# Patient Record
Sex: Female | Born: 1960 | Race: White | Hispanic: No | Marital: Married | Smoking: Never smoker
Health system: Southern US, Community
[De-identification: ages and names within clinical notes are randomized; demographics above are authoritative.]

---

## 2011-01-15 ENCOUNTER — Emergency Department (INDEPENDENT_AMBULATORY_CARE_PROVIDER_SITE_OTHER): Payer: 59

## 2011-01-15 ENCOUNTER — Emergency Department (HOSPITAL_BASED_OUTPATIENT_CLINIC_OR_DEPARTMENT_OTHER)
Admission: EM | Admit: 2011-01-15 | Discharge: 2011-01-15 | Disposition: A | Discharge status: Home / Self Care | Payer: 59 | Attending: Emergency Medicine | Admitting: Emergency Medicine

## 2011-01-15 DIAGNOSIS — S52123A Displaced fracture of head of unspecified radius, initial encounter for closed fracture: Secondary | ICD-10-CM

## 2011-01-15 DIAGNOSIS — W19XXXA Unspecified fall, initial encounter: Secondary | ICD-10-CM

## 2011-01-15 DIAGNOSIS — M25629 Stiffness of unspecified elbow, not elsewhere classified: Secondary | ICD-10-CM

## 2011-01-15 DIAGNOSIS — M25429 Effusion, unspecified elbow: Secondary | ICD-10-CM

## 2011-01-15 DIAGNOSIS — Y9354 Activity, bowling: Secondary | ICD-10-CM | POA: Insufficient documentation

## 2011-01-15 DIAGNOSIS — W010XXA Fall on same level from slipping, tripping and stumbling without subsequent striking against object, initial encounter: Secondary | ICD-10-CM | POA: Insufficient documentation

## 2011-01-15 DIAGNOSIS — Y9239 Other specified sports and athletic area as the place of occurrence of the external cause: Secondary | ICD-10-CM | POA: Insufficient documentation

## 2012-02-03 ENCOUNTER — Emergency Department (HOSPITAL_BASED_OUTPATIENT_CLINIC_OR_DEPARTMENT_OTHER)
Admission: EM | Admit: 2012-02-03 | Discharge: 2012-02-03 | Disposition: A | Discharge status: Home / Self Care | Payer: Worker's Compensation | Attending: Emergency Medicine | Admitting: Emergency Medicine

## 2012-02-03 ENCOUNTER — Encounter (HOSPITAL_BASED_OUTPATIENT_CLINIC_OR_DEPARTMENT_OTHER): Payer: Self-pay | Admitting: *Deleted

## 2012-02-03 ENCOUNTER — Emergency Department (HOSPITAL_BASED_OUTPATIENT_CLINIC_OR_DEPARTMENT_OTHER): Payer: Worker's Compensation

## 2012-02-03 DIAGNOSIS — S46919A Strain of unspecified muscle, fascia and tendon at shoulder and upper arm level, unspecified arm, initial encounter: Secondary | ICD-10-CM

## 2012-02-03 DIAGNOSIS — T148XXA Other injury of unspecified body region, initial encounter: Secondary | ICD-10-CM

## 2012-02-03 DIAGNOSIS — IMO0002 Reserved for concepts with insufficient information to code with codable children: Secondary | ICD-10-CM | POA: Insufficient documentation

## 2012-02-03 DIAGNOSIS — W010XXA Fall on same level from slipping, tripping and stumbling without subsequent striking against object, initial encounter: Secondary | ICD-10-CM | POA: Insufficient documentation

## 2012-02-03 DIAGNOSIS — W19XXXA Unspecified fall, initial encounter: Secondary | ICD-10-CM

## 2012-02-03 MED ORDER — OXYCODONE-ACETAMINOPHEN 5-325 MG PO TABS
1.0000 | ORAL_TABLET | Freq: Once | ORAL | Status: AC
  Administered 2012-02-03: 1 via ORAL
  Filled 2012-02-03: qty 1

## 2012-02-03 MED ORDER — MORPHINE SULFATE 4 MG/ML IJ SOLN
4.0000 mg | Freq: Once | INTRAMUSCULAR | Status: AC
  Administered 2012-02-03: 4 mg via INTRAMUSCULAR
  Filled 2012-02-03: qty 1

## 2012-02-03 MED ORDER — OXYCODONE-ACETAMINOPHEN 5-325 MG PO TABS: 2.0000 | ORAL_TABLET | ORAL | Status: AC | PRN

## 2012-02-03 NOTE — ED Notes (Signed)
Pt states she lost her footing and tripped injuring her left elbow and knee. Moves fingers. Feels touch. Cap refill < 3 sec. Abrasions to same. Ice applied. Rings removed and given to daughter.

## 2012-02-03 NOTE — ED Provider Notes (Signed)
History     CSN: [id number]  Arrival date & time 02/03/12  1740   First MD Initiated Contact with Patient 02/03/12 1811      Chief Complaint  Patient presents with  . Fall    (Consider location/radiation/quality/duration/timing/severity/associated sxs/prior treatment) HPI Comments: Pt states that she tripped and fell and is now having left elbow and knee pain:pt states that she has previous fracture to the elbow:pt states that she doesn't want imaging of her knee  Patient is a 51 y.o. female presenting with fall. The history is provided by the patient. No language interpreter was used.  Fall The accident occurred 1 to 2 hours ago. The fall occurred while standing. She landed on a hard floor. There was no blood loss. The point of impact was the left elbow. The pain is present in the left knee and left elbow. The pain is moderate. She was ambulatory at the scene. There was no entrapment after the fall. There was no drug use involved in the accident. There was no alcohol use involved in the accident. She has tried NSAIDs for the symptoms. The treatment provided mild relief.    History reviewed. No pertinent past medical history.  History reviewed. No pertinent past surgical history.  History reviewed. No pertinent family history.  History  Substance Use Topics  . Smoking status: Never Smoker   . Smokeless tobacco: Not on file  . Alcohol Use: No    OB History    Grav Para Term Preterm Abortions TAB SAB Ect Mult Living                  Review of Systems  Constitutional: Negative.   Respiratory: Negative.   Cardiovascular: Negative.   Neurological: Negative.     Allergies  Review of patient's allergies indicates no known allergies.  Home Medications   Current Outpatient Rx  Name Route Sig Dispense Refill  . IBUPROFEN 400 MG PO TABS Oral Take 400 mg by mouth every 6 (six) hours as needed. Patient used this medication for sleep.      BP 125/79  Pulse 68  Temp 98 F  (36.7 C) (Oral)  Resp 20  Ht 5\' 4"  (1.626 m)  Wt 220 lb (99.791 kg)  BMI 37.76 kg/m2  SpO2 100%  Physical Exam  Nursing note and vitals reviewed. Constitutional: She is oriented to person, place, and time. She appears well-developed and well-nourished.  HENT:  Head: Normocephalic and atraumatic.  Cardiovascular: Normal rate and regular rhythm.   Pulmonary/Chest: Effort normal and breath sounds normal.  Musculoskeletal:       Spine non tender:pt has generalized tenderness to the left elbow:pt has full BMW:UXLKGM intact;Neurovascularly intact  Neurological: She is alert and oriented to person, place, and time. She exhibits normal muscle tone. Coordination normal.  Skin:       Abrasion to the left knee and forearm  Psychiatric: She has a normal mood and affect.    ED Course  Procedures (including critical care time)  Labs Reviewed - No data to display Dg Elbow Complete Left  02/03/2012  *RADIOLOGY REPORT*  Clinical Data: Fall, elbow pain. Previous fracture.  LEFT ELBOW - COMPLETE 3+ VIEW  Comparison: 01/15/2011.  Findings:   There is mild irregularity within the radial head in the area of prior fracture.  I see no definite acute fracture.  No joint effusions seen.  IMPRESSION: Irregularity in the area of prior fracture.  No definite acute fracture.  Original Report Authenticated By: Caryn Bee  G. DOVER, M.D.     1. Elbow strain   2. Abrasion   3. Fall       MDM  No acute injury:pt placed in sling for comfort:will treat pt with something for pain        Teressa Lower, NP 02/03/12 2008

## 2012-02-03 NOTE — ED Provider Notes (Signed)
Medical screening examination/treatment/procedure(s) were performed by non-physician practitioner and as supervising physician I was immediately available for consultation/collaboration.   Rolan Bucco, MD 02/03/12 2106

## 2012-02-03 NOTE — Discharge Instructions (Signed)
Abrasions Abrasions are skin scrapes. Their treatment depends on how large and deep the abrasion is. Abrasions do not extend through all layers of the skin. A cut or lesion through all skin layers is called a laceration. HOME CARE INSTRUCTIONS   If you were given a dressing, change it at least once a day or as instructed by your caregiver. If the bandage sticks, soak it off with a solution of water or hydrogen peroxide.   Twice a day, wash the area with soap and water to remove all the cream/ointment. You may do this in a sink, under a tub faucet, or in a shower. Rinse off the soap and pat dry with a clean towel. Look for signs of infection (see below).   Reapply cream/ointment according to your caregiver's instruction. This will help prevent infection and keep the bandage from sticking. Telfa or gauze over the wound and under the dressing or wrap will also help keep the bandage from sticking.   If the bandage becomes wet, dirty, or develops a foul smell, change it as soon as possible.   Only take over-the-counter or prescription medicines for pain, discomfort, or fever as directed by your caregiver.  SEEK IMMEDIATE MEDICAL CARE IF:   Increasing pain in the wound.   Signs of infection develop: redness, swelling, surrounding area is tender to touch, or pus coming from the wound.   You have a fever.   Any foul smell coming from the wound or dressing.  Most skin wounds heal within ten days. Facial wounds heal faster. However, an infection may occur despite proper treatment. You should have the wound checked for signs of infection within 24 to 48 hours or sooner if problems arise. If you were not given a wound-check appointment, look closely at the wound yourself on the second day for early signs of infection listed above. MAKE SURE YOU:   Understand these instructions.   Will watch your condition.   Will get help right away if you are not doing well or get worse.  Document Released:  05/10/2005 Document Revised: 07/20/2011 Document Reviewed: 07/04/2011 [facility] Patient Information 2012 [place], [place].Elbow Injury Minor fractures, sprains, and bruises of the elbow will all cause swelling and pain. X-rays often show swelling around the joint but may not show a fracture line on x-rays taken right after the injury. The treatment for all these types of injuries is to reduce swelling and pain and to rest the joint until movement is painless. Repeat exam and x-rays several weeks after an elbow injury may show a minor fracture not seen on the initial exam. Most of the time a sling is needed for the first days or weeks after the injury. Apply ice packs to the elbow for 20-30 minutes every 2 hours for the next few days. Keep your elbow elevated above the level of your heart as much as possible until the pain and swelling are better. An elastic wrap or splint may also be used to reduce movement in addition to a sling. Call your caregiver for follow-up care within one week. Keeping the elbow immobilized for too long can hurt recovery.  SEEK MEDICAL CARE IF:   Your pain increases, or if you develop a numb, cold, or pale forearm or hand.   You are not improving.   You have any other questions or concerns regarding your injury.  Document Released: 09/07/2004 Document Revised: 07/20/2011 Document Reviewed: 08/19/2008 [facility] Patient Information 2012 [place], [place].

## 2012-06-10 IMAGING — CR DG ELBOW COMPLETE 3+V*L*
4 series · 4 of 4 positions shown · non-contrast
Comparison: None.

CLINICAL DATA: Injury from fall while bowling last night.  Pain in
the left elbow.

LEFT ELBOW - COMPLETE 3+ VIEW

[x elbow joint obl. left (1 of 2)]
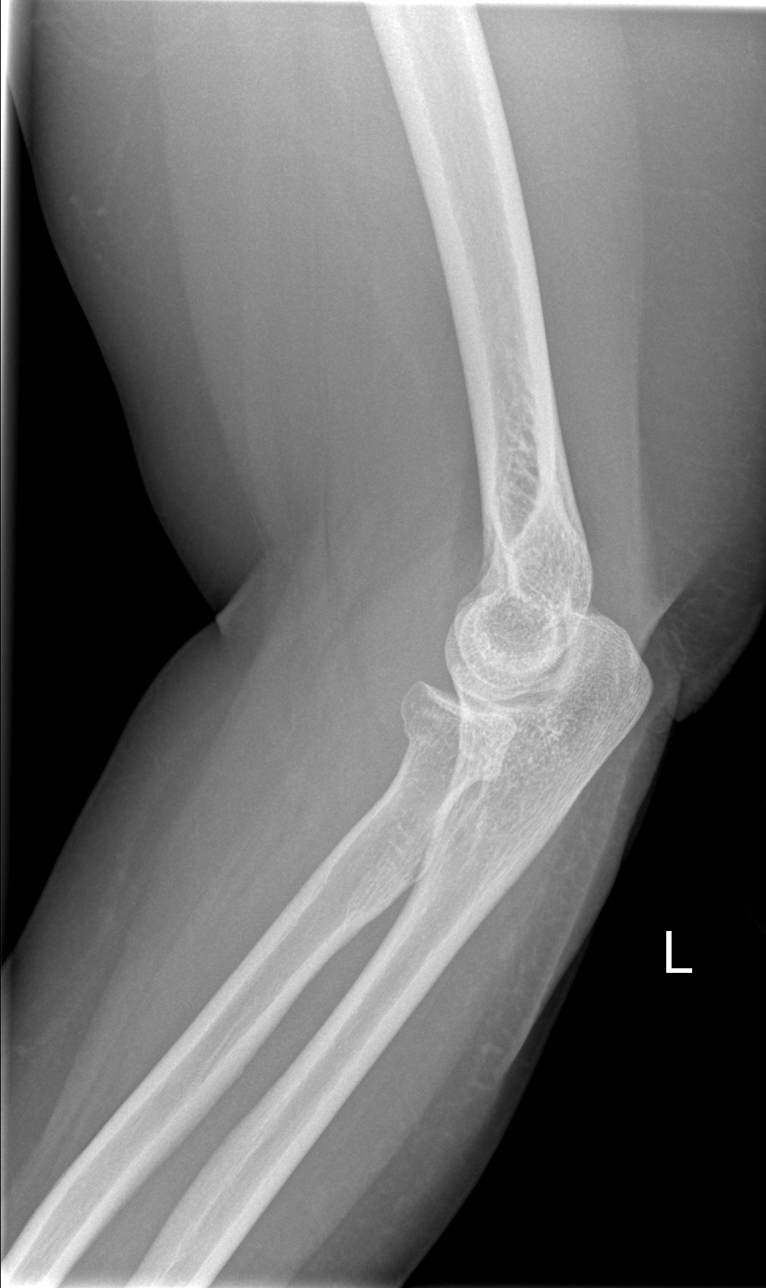

[x elbow joint lat left]
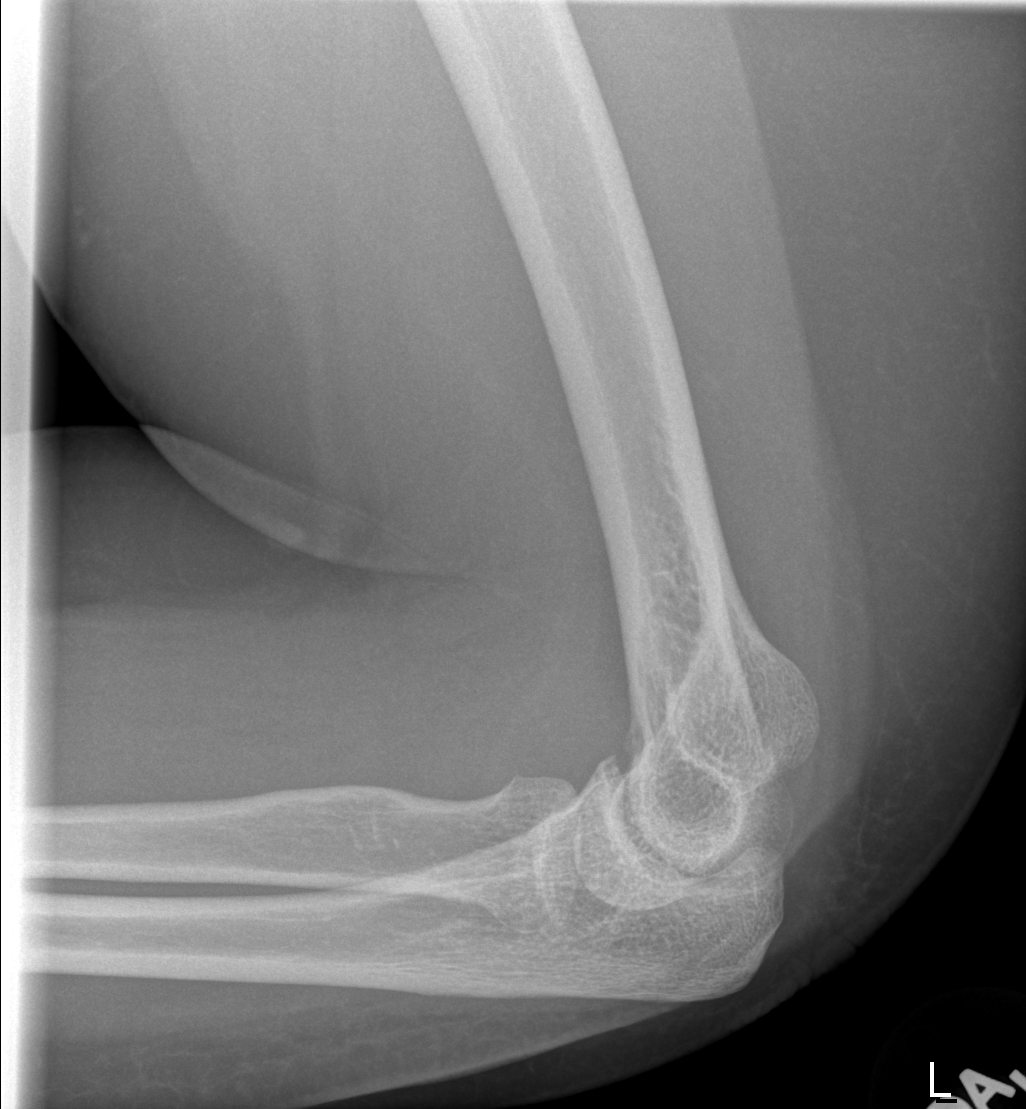

[x elbow joint ap left]
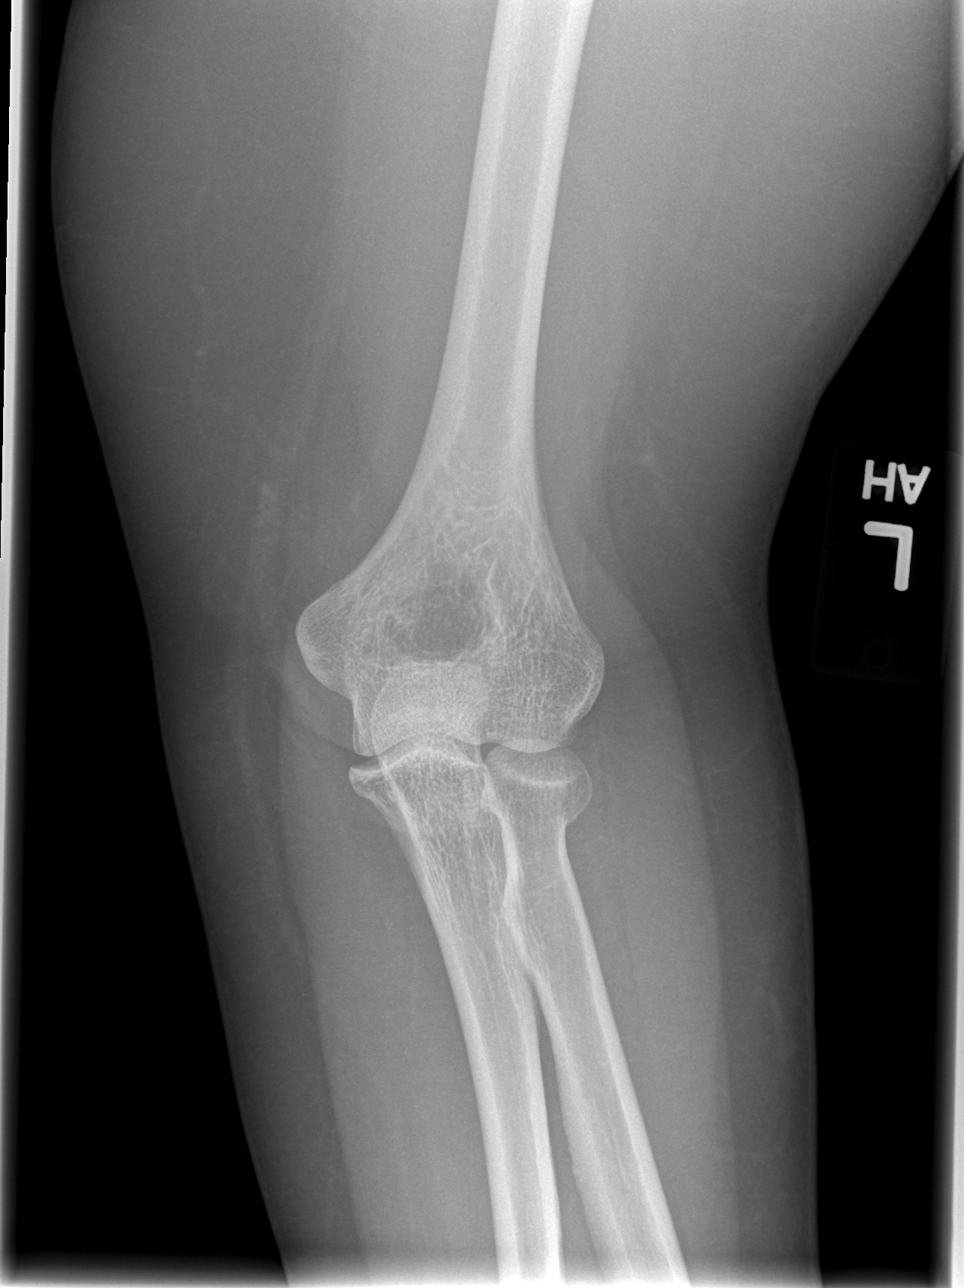

[x elbow joint obl. left (2 of 2)]
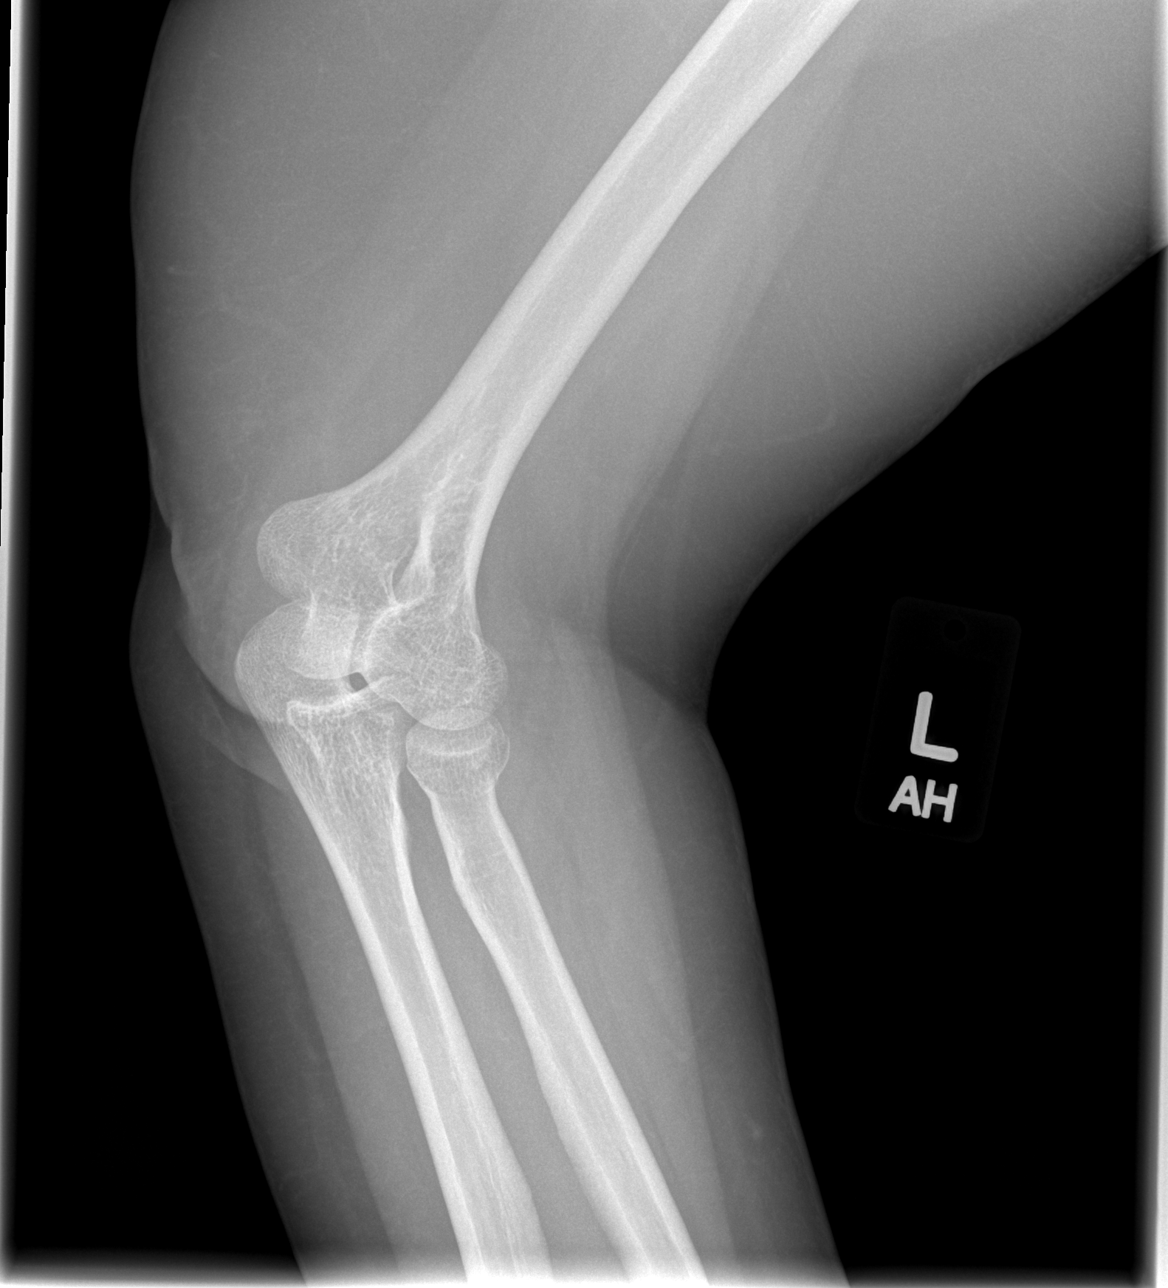

[4 of 4 positions shown; findings below may reference images not displayed]

FINDINGS: There is a small left elbow effusion with elevation of
the anterior and posterior fat pads.  Slight cortical irregularity
along the proximal radial neck with linear extension into the
radial head suggesting nondisplaced acute fracture.  The distal
humerus and proximal ulna appear intact.  No dislocation of the
elbow.  No focal bone lesion.  No abnormal radiopaque densities in
the soft tissues.
IMPRESSION: Nondisplaced fracture of the left radial head/neck with small
effusion.

## 2013-05-08 ENCOUNTER — Encounter (HOSPITAL_BASED_OUTPATIENT_CLINIC_OR_DEPARTMENT_OTHER): Payer: Self-pay | Admitting: Emergency Medicine

## 2013-05-08 ENCOUNTER — Emergency Department (HOSPITAL_BASED_OUTPATIENT_CLINIC_OR_DEPARTMENT_OTHER)
Admission: EM | Admit: 2013-05-08 | Discharge: 2013-05-08 | Disposition: A | Discharge status: Home / Self Care | Payer: 59 | Attending: Emergency Medicine | Admitting: Emergency Medicine

## 2013-05-08 ENCOUNTER — Emergency Department (HOSPITAL_BASED_OUTPATIENT_CLINIC_OR_DEPARTMENT_OTHER): Payer: 59

## 2013-05-08 DIAGNOSIS — Y9389 Activity, other specified: Secondary | ICD-10-CM | POA: Insufficient documentation

## 2013-05-08 DIAGNOSIS — Y929 Unspecified place or not applicable: Secondary | ICD-10-CM | POA: Insufficient documentation

## 2013-05-08 DIAGNOSIS — S93602A Unspecified sprain of left foot, initial encounter: Secondary | ICD-10-CM

## 2013-05-08 DIAGNOSIS — S93609A Unspecified sprain of unspecified foot, initial encounter: Secondary | ICD-10-CM | POA: Insufficient documentation

## 2013-05-08 DIAGNOSIS — W010XXA Fall on same level from slipping, tripping and stumbling without subsequent striking against object, initial encounter: Secondary | ICD-10-CM | POA: Insufficient documentation

## 2013-05-08 MED ORDER — HYDROCODONE-ACETAMINOPHEN 5-325 MG PO TABS: 2.0000 | ORAL_TABLET | ORAL | Status: AC | PRN

## 2013-05-08 NOTE — ED Provider Notes (Signed)
Medical screening examination/treatment/procedure(s) were performed by non-physician practitioner and as supervising physician I was immediately available for consultation/collaboration.   Kloi Brodman B. Bernette Mayers, MD 05/08/13 2053

## 2013-05-08 NOTE — ED Notes (Signed)
Pt reports tripping and falling and "hurting" her L foot

## 2013-05-08 NOTE — ED Provider Notes (Signed)
CSN: [id number]     Arrival date & time 05/08/13  1825 History   First MD Initiated Contact with Patient 05/08/13 1858     Chief Complaint  Patient presents with  . Fall  . Ankle Pain    L   (Consider location/radiation/quality/duration/timing/severity/associated sxs/prior Treatment) Patient is a 52 y.o. female presenting with foot injury. The history is provided by the patient. No language interpreter was used.  Foot Injury Location:  Foot Time since incident:  1 day Injury: yes   Foot location:  L foot Pain details:    Quality:  Aching   Severity:  No pain   Onset quality:  Gradual   Progression:  Worsening Chronicity:  New Dislocation: no   Foreign body present:  No foreign bodies Relieved by:  Nothing Ineffective treatments:  None tried Associated symptoms: swelling     History reviewed. No pertinent past medical history. History reviewed. No pertinent past surgical history. History reviewed. No pertinent family history. History  Substance Use Topics  . Smoking status: Never Smoker   . Smokeless tobacco: Not on file  . Alcohol Use: No   OB History   Grav Para Term Preterm Abortions TAB SAB Ect Mult Living                 Review of Systems  Musculoskeletal: Positive for myalgias and joint swelling.  All other systems reviewed and are negative.    Allergies  Review of patient's allergies indicates no known allergies.  Home Medications   Current Outpatient Rx  Name  Route  Sig  Dispense  Refill  . ibuprofen (ADVIL,MOTRIN) 400 MG tablet   Oral   Take 400 mg by mouth every 6 (six) hours as needed. Patient used this medication for sleep.          BP 141/90  Pulse 77  Temp(Src) 98.2 F (36.8 C) (Oral)  Resp 20  Wt 220 lb (99.791 kg)  BMI 37.74 kg/m2  SpO2 100% Physical Exam  Nursing note and vitals reviewed. Constitutional: She appears well-developed and well-nourished.  HENT:  Head: Normocephalic.  Cardiovascular: Normal rate.    Pulmonary/Chest: Effort normal.  Musculoskeletal: She exhibits tenderness.  Swollen tender left foot,  Tender lateral foot  Neurological: She is alert.  Skin: Skin is warm.  Psychiatric: She has a normal mood and affect.    ED Course  Procedures (including critical care time) Labs Review Labs Reviewed - No data to display Imaging Review No results found.  MDM   1. Foot sprain, left, initial encounter    Pt advised follow up with Dr. Pearletha Forge for recheck next week, ace wrap post op shoe    Elson Areas, [place] 05/08/13 2041

## 2014-04-07 ENCOUNTER — Other Ambulatory Visit: Payer: Self-pay | Admitting: Orthopedic Surgery

## 2014-04-07 DIAGNOSIS — M25512 Pain in left shoulder: Secondary | ICD-10-CM

## 2014-04-07 DIAGNOSIS — M546 Pain in thoracic spine: Secondary | ICD-10-CM

## 2014-04-15 ENCOUNTER — Ambulatory Visit
Admission: RE | Admit: 2014-04-15 | Discharge: 2014-04-15 | Disposition: A | Discharge status: Home / Self Care | Payer: 59 | Source: Ambulatory Visit | Attending: Orthopedic Surgery | Admitting: Orthopedic Surgery

## 2014-04-15 DIAGNOSIS — M546 Pain in thoracic spine: Secondary | ICD-10-CM

## 2014-04-15 DIAGNOSIS — M25512 Pain in left shoulder: Secondary | ICD-10-CM
